# Patient Record
Sex: Male | Born: 1990 | Race: White | Hispanic: No | Marital: Married | Smoking: Current every day smoker
Health system: Southern US, Community
[De-identification: ages and names within clinical notes are randomized; demographics above are authoritative.]

---

## 2019-02-19 ENCOUNTER — Emergency Department (HOSPITAL_COMMUNITY): Payer: No Typology Code available for payment source

## 2019-02-19 ENCOUNTER — Emergency Department (HOSPITAL_COMMUNITY)
Admission: EM | Admit: 2019-02-19 | Discharge: 2019-02-19 | Disposition: A | Discharge status: Home / Self Care | Payer: No Typology Code available for payment source | Attending: Emergency Medicine | Admitting: Emergency Medicine

## 2019-02-19 ENCOUNTER — Other Ambulatory Visit: Payer: Self-pay

## 2019-02-19 ENCOUNTER — Encounter (HOSPITAL_COMMUNITY): Payer: Self-pay

## 2019-02-19 DIAGNOSIS — S59902A Unspecified injury of left elbow, initial encounter: Secondary | ICD-10-CM | POA: Diagnosis present

## 2019-02-19 DIAGNOSIS — S80212A Abrasion, left knee, initial encounter: Secondary | ICD-10-CM | POA: Insufficient documentation

## 2019-02-19 DIAGNOSIS — Y999 Unspecified external cause status: Secondary | ICD-10-CM | POA: Diagnosis not present

## 2019-02-19 DIAGNOSIS — S50312A Abrasion of left elbow, initial encounter: Secondary | ICD-10-CM | POA: Insufficient documentation

## 2019-02-19 DIAGNOSIS — R079 Chest pain, unspecified: Secondary | ICD-10-CM | POA: Insufficient documentation

## 2019-02-19 DIAGNOSIS — S7001XA Contusion of right hip, initial encounter: Secondary | ICD-10-CM | POA: Insufficient documentation

## 2019-02-19 DIAGNOSIS — Y92410 Unspecified street and highway as the place of occurrence of the external cause: Secondary | ICD-10-CM | POA: Insufficient documentation

## 2019-02-19 DIAGNOSIS — Y9389 Activity, other specified: Secondary | ICD-10-CM | POA: Insufficient documentation

## 2019-02-19 DIAGNOSIS — F17210 Nicotine dependence, cigarettes, uncomplicated: Secondary | ICD-10-CM | POA: Diagnosis not present

## 2019-02-19 DIAGNOSIS — T07XXXA Unspecified multiple injuries, initial encounter: Secondary | ICD-10-CM

## 2019-02-19 MED ORDER — FENTANYL CITRATE (PF) 100 MCG/2ML IJ SOLN
50.0000 ug | Freq: Once | INTRAMUSCULAR | Status: AC
  Administered 2019-02-19: 50 ug via INTRAVENOUS
  Filled 2019-02-19: qty 2

## 2019-02-19 NOTE — ED Notes (Signed)
C-collar removed by Dr. Ronnald Nian.
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
## 2019-02-19 NOTE — ED Notes (Signed)
Pt requested to speak to [employer].
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
## 2019-02-19 NOTE — ED Notes (Signed)
Patient transported to X-ray 
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
